# Patient Record
Sex: Male | Born: 1970
Health system: Southern US, Community
[De-identification: ages and names within clinical notes are randomized; demographics above are authoritative.]

## PROBLEM LIST (undated history)

## (undated) ENCOUNTER — Ambulatory Visit: Payer: 59

## (undated) DIAGNOSIS — M109 Gout, unspecified: Secondary | ICD-10-CM

## (undated) DIAGNOSIS — E785 Hyperlipidemia, unspecified: Secondary | ICD-10-CM

## (undated) DIAGNOSIS — N529 Male erectile dysfunction, unspecified: Secondary | ICD-10-CM

## (undated) DIAGNOSIS — E291 Testicular hypofunction: Secondary | ICD-10-CM

## (undated) HISTORY — DX: Male erectile dysfunction, unspecified: N52.9

## (undated) HISTORY — DX: Hyperlipidemia, unspecified: E78.5

## (undated) HISTORY — DX: Gout, unspecified: M10.9

## (undated) HISTORY — DX: Testicular hypofunction: E29.1

---

## 2007-03-20 ENCOUNTER — Ambulatory Visit: Payer: Self-pay | Admitting: General Practice

## 2008-12-01 IMAGING — CR DG CHEST 2V
1 series · 2 of 2 positions shown · non-contrast
Comparison: none

REASON FOR EXAM: OCCUPATIONAL HEALTH SCREEN
COMMENTS:

PROCEDURE:     MDR - MDR CHEST PA(OR AP) AND LATERAL  - March 20, 2007  [DATE]
RESULT:     The lung fields are clear. No pneumonia, pneumothorax or pleural
effusion is seen.  Heart size is normal. The mediastinal and osseous
structures show no significant abnormalities.

[Series 1: view not recorded · 0.17mm/px · 2 of 2 slices shown]
[im 1/2]
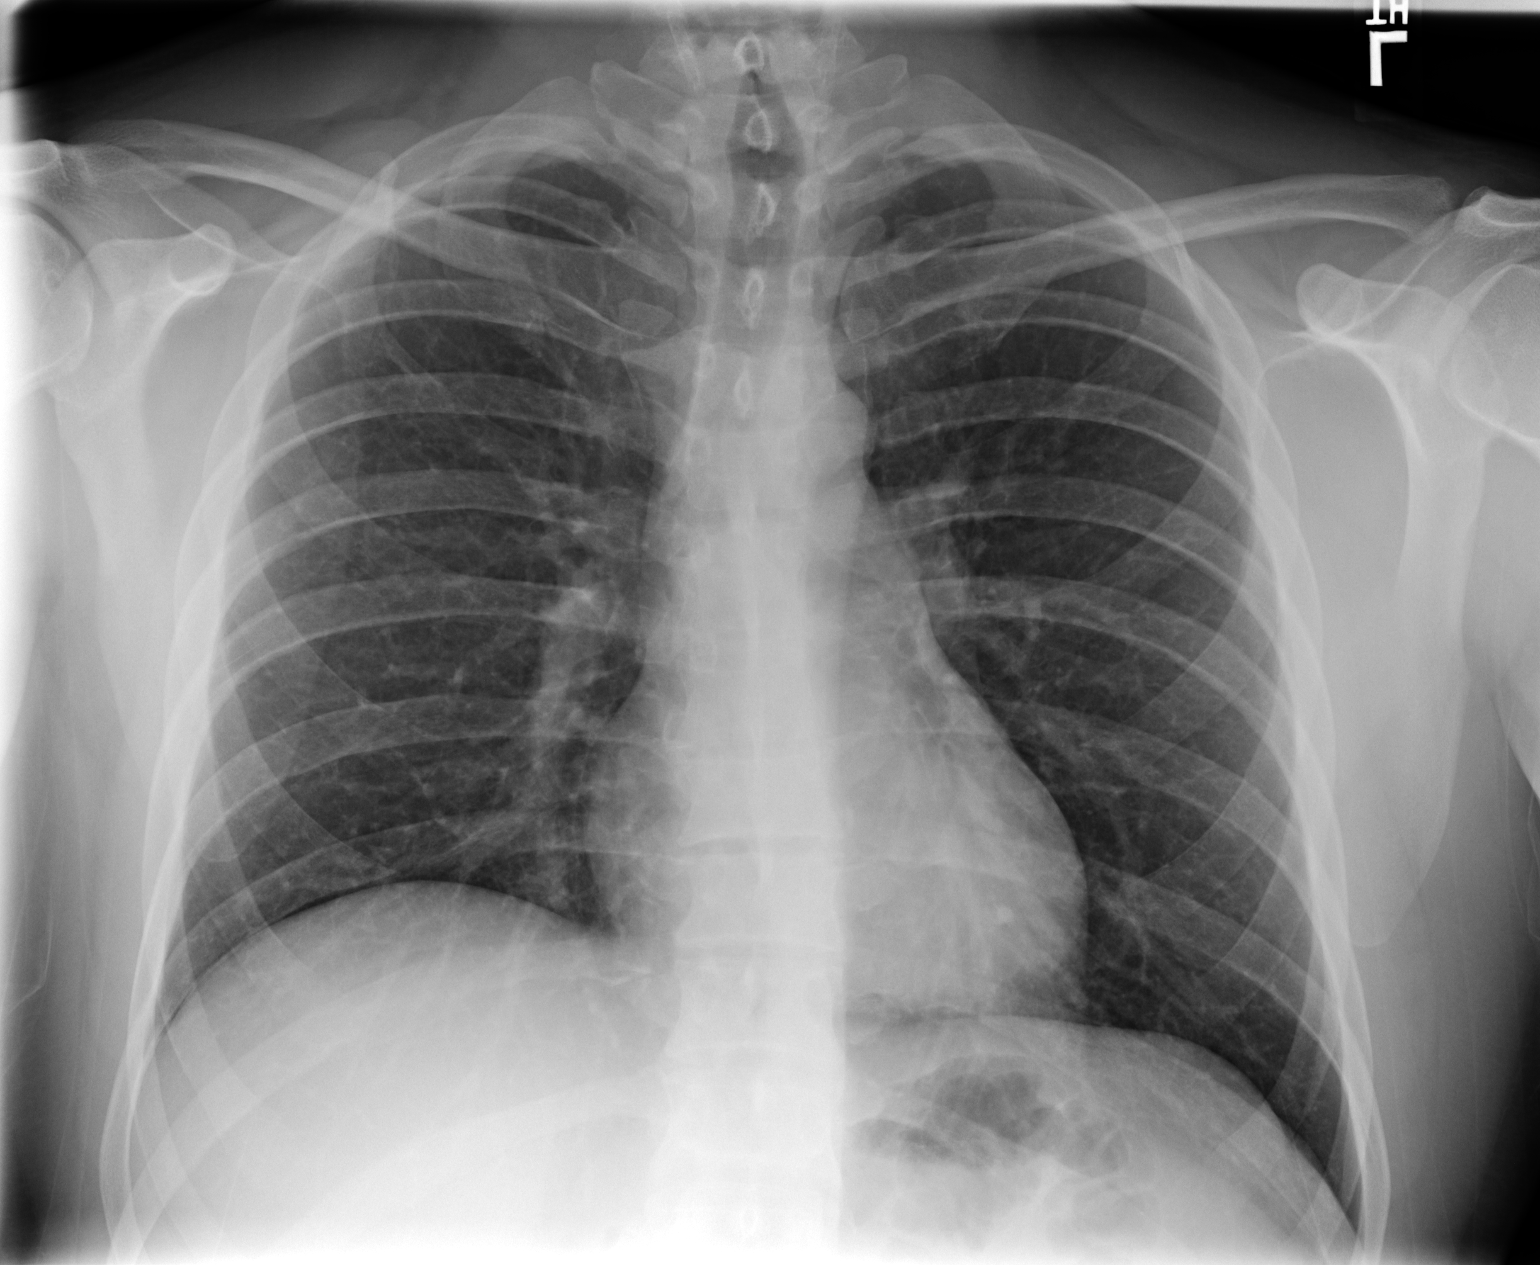
[im 2/2]
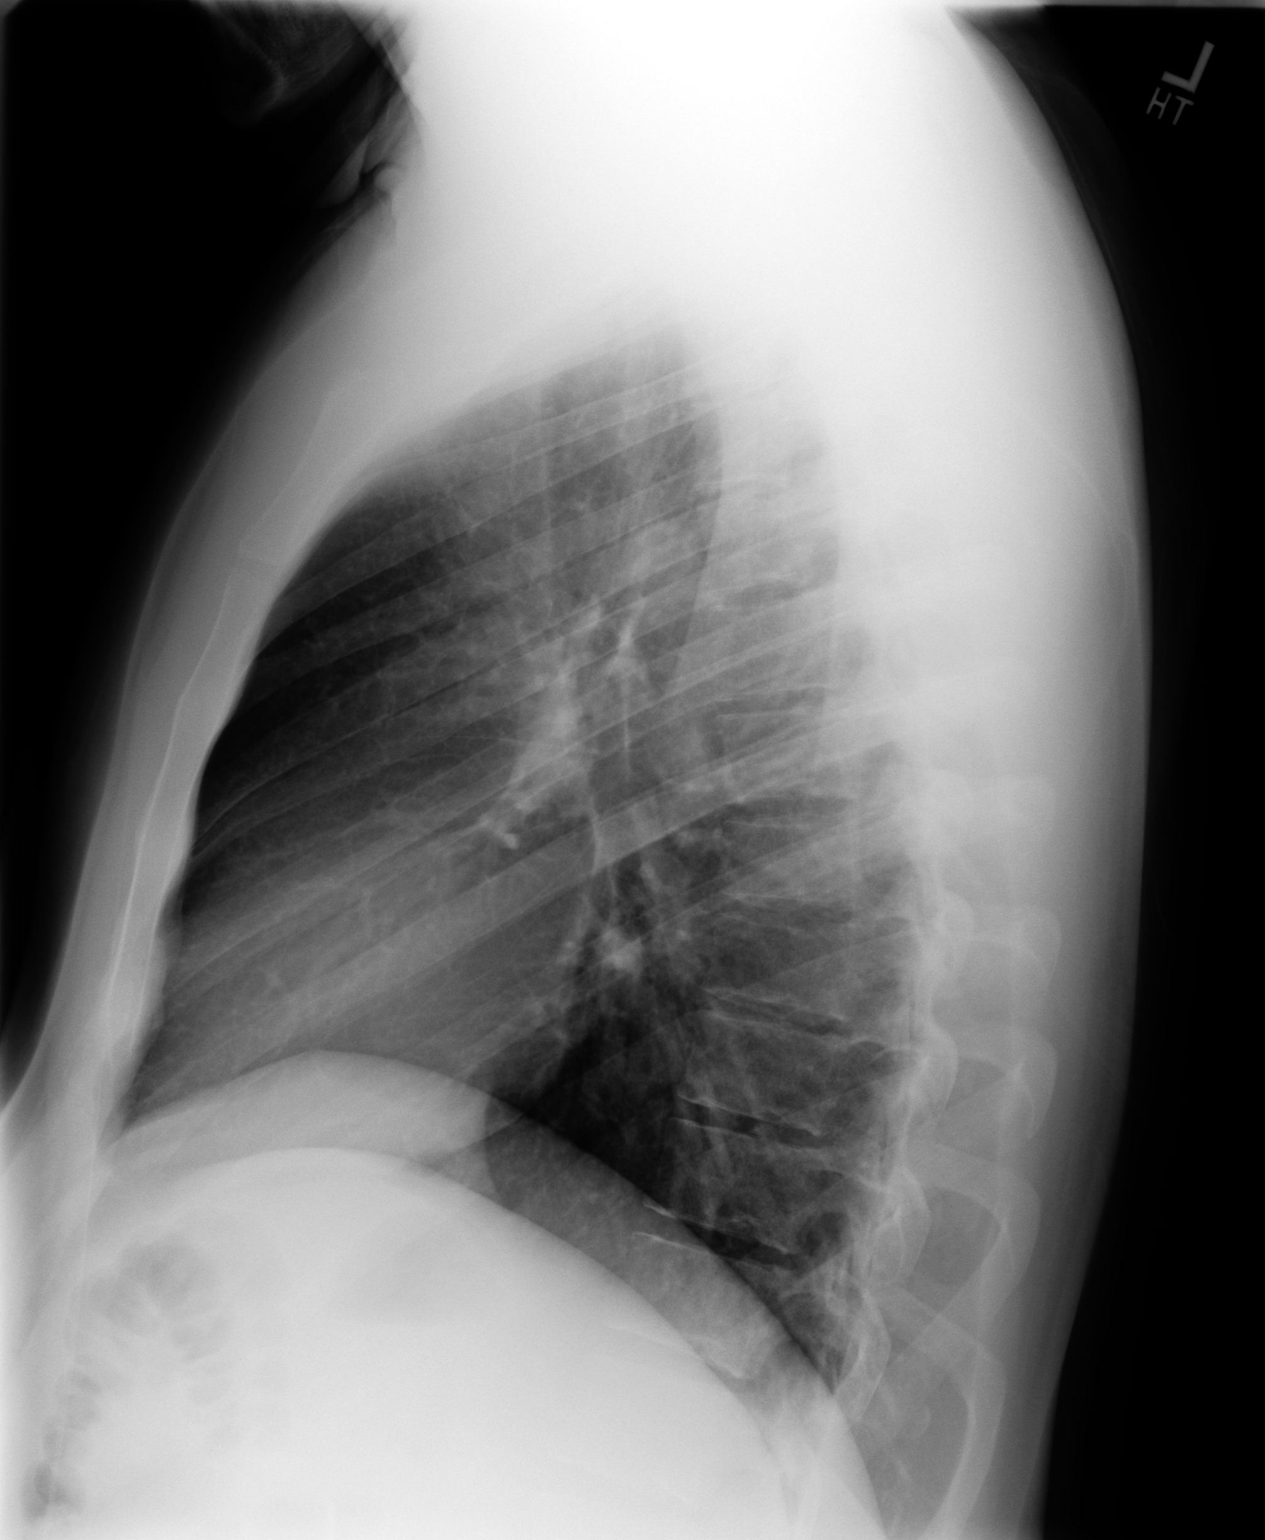

[2 of 2 positions shown; findings below may reference images not displayed]

IMPRESSION: 1.     No significant abnormalities are noted.

## 2009-06-16 ENCOUNTER — Ambulatory Visit: Payer: Self-pay | Admitting: Otolaryngology

## 2011-07-31 ENCOUNTER — Ambulatory Visit: Payer: Self-pay | Admitting: Medical

## 2013-07-17 ENCOUNTER — Other Ambulatory Visit: Payer: Self-pay

## 2013-07-17 ENCOUNTER — Inpatient Hospital Stay: Payer: Self-pay

## 2013-07-17 LAB — BODY FLUID CELL COUNT WITH DIFFERENTIAL
BASOS ABS: 0 %
Eosinophil: 0 %
LYMPHS PCT: 0 %
Neutrophils: 89 %
Nucleated Cell Count: 105837 /mm3
OTHER CELLS BF: 0 %
OTHER MONONUCLEAR CELLS: 11 %

## 2013-07-17 LAB — CBC WITH DIFFERENTIAL/PLATELET
BASOS ABS: 0 10*3/uL (ref 0.0–0.1)
BASOS PCT: 0.3 %
Eosinophil #: 0 10*3/uL (ref 0.0–0.7)
Eosinophil %: 0 %
HCT: 42.7 % (ref 40.0–52.0)
HGB: 14.6 g/dL (ref 13.0–18.0)
Lymphocyte #: 1.3 10*3/uL (ref 1.0–3.6)
Lymphocyte %: 9.4 %
MCH: 32.5 pg (ref 26.0–34.0)
MCHC: 34.2 g/dL (ref 32.0–36.0)
MCV: 95 fL (ref 80–100)
MONO ABS: 1.4 x10 3/mm — AB (ref 0.2–1.0)
MONOS PCT: 10.5 %
NEUTROS PCT: 79.8 %
Neutrophil #: 10.9 10*3/uL — ABNORMAL HIGH (ref 1.4–6.5)
Platelet: 231 10*3/uL (ref 150–440)
RBC: 4.48 10*6/uL (ref 4.40–5.90)
RDW: 12.9 % (ref 11.5–14.5)
WBC: 13.7 10*3/uL — AB (ref 3.8–10.6)

## 2013-07-17 LAB — COMPREHENSIVE METABOLIC PANEL
ALBUMIN: 3.8 g/dL (ref 3.4–5.0)
ANION GAP: 6 — AB (ref 7–16)
AST: 24 U/L (ref 15–37)
Alkaline Phosphatase: 99 U/L
BUN: 11 mg/dL (ref 7–18)
Bilirubin,Total: 3.1 mg/dL — ABNORMAL HIGH (ref 0.2–1.0)
CO2: 27 mmol/L (ref 21–32)
Calcium, Total: 8.7 mg/dL (ref 8.5–10.1)
Chloride: 103 mmol/L (ref 98–107)
Creatinine: 1.36 mg/dL — ABNORMAL HIGH (ref 0.60–1.30)
EGFR (Non-African Amer.): >60
Glucose: 119 mg/dL — ABNORMAL HIGH (ref 65–99)
Osmolality: 272 (ref 275–301)
Potassium: 3.7 mmol/L (ref 3.5–5.1)
SGPT (ALT): 33 U/L (ref 12–78)
Sodium: 136 mmol/L (ref 136–145)
Total Protein: 7.4 g/dL (ref 6.4–8.2)

## 2013-07-18 LAB — BASIC METABOLIC PANEL
Anion Gap: 4 — ABNORMAL LOW (ref 7–16)
BUN: 11 mg/dL (ref 7–18)
CALCIUM: 8.7 mg/dL (ref 8.5–10.1)
Chloride: 104 mmol/L (ref 98–107)
Co2: 27 mmol/L (ref 21–32)
Creatinine: 1.43 mg/dL — ABNORMAL HIGH (ref 0.60–1.30)
Glucose: 128 mg/dL — ABNORMAL HIGH (ref 65–99)
OSMOLALITY: 271 (ref 275–301)
Potassium: 4.2 mmol/L (ref 3.5–5.1)
Sodium: 135 mmol/L — ABNORMAL LOW (ref 136–145)

## 2013-07-18 LAB — CBC WITH DIFFERENTIAL/PLATELET
Basophil #: 0 10*3/uL (ref 0.0–0.1)
Basophil %: 0.3 %
EOS PCT: 0.1 %
Eosinophil #: 0 10*3/uL (ref 0.0–0.7)
HCT: 42 % (ref 40.0–52.0)
HGB: 14.4 g/dL (ref 13.0–18.0)
LYMPHS ABS: 1.2 10*3/uL (ref 1.0–3.6)
Lymphocyte %: 10.2 %
MCH: 33.1 pg (ref 26.0–34.0)
MCHC: 34.3 g/dL (ref 32.0–36.0)
MCV: 97 fL (ref 80–100)
MONO ABS: 1 x10 3/mm (ref 0.2–1.0)
Monocyte %: 9.2 %
Neutrophil #: 9.1 10*3/uL — ABNORMAL HIGH (ref 1.4–6.5)
Neutrophil %: 80.2 %
Platelet: 211 10*3/uL (ref 150–440)
RBC: 4.35 10*6/uL — ABNORMAL LOW (ref 4.40–5.90)
RDW: 13.1 % (ref 11.5–14.5)
WBC: 11.3 10*3/uL — ABNORMAL HIGH (ref 3.8–10.6)

## 2013-07-19 LAB — BASIC METABOLIC PANEL
Anion Gap: 4 — ABNORMAL LOW (ref 7–16)
BUN: 9 mg/dL (ref 7–18)
CO2: 28 mmol/L (ref 21–32)
CREATININE: 1.19 mg/dL (ref 0.60–1.30)
Calcium, Total: 8.4 mg/dL — ABNORMAL LOW (ref 8.5–10.1)
Chloride: 106 mmol/L (ref 98–107)
EGFR (African American): >60
EGFR (Non-African Amer.): >60
Glucose: 116 mg/dL — ABNORMAL HIGH (ref 65–99)
Osmolality: 275 (ref 275–301)
Potassium: 4.1 mmol/L (ref 3.5–5.1)
SODIUM: 138 mmol/L (ref 136–145)

## 2013-07-19 LAB — SEDIMENTATION RATE: Erythrocyte Sed Rate: 57 mm/hr — ABNORMAL HIGH (ref 0–15)

## 2013-07-21 LAB — BODY FLUID CULTURE

## 2013-07-22 LAB — CULTURE, BLOOD (SINGLE)

## 2013-07-31 DIAGNOSIS — L270 Generalized skin eruption due to drugs and medicaments taken internally: Secondary | ICD-10-CM | POA: Insufficient documentation

## 2014-07-06 NOTE — H&P (Signed)
PATIENT NAME:  Jeremy Blackburn, Jeremy Blackburn MR#:  [id number] DATE OF BIRTH:  23-Sep-1970  DATE OF ADMISSION:  07/17/2013  ADMISSION DIAGNOSES: Left elbow cellulitis, abscess and sepsis.   HISTORY OF PRESENT ILLNESS: This is a very pleasant 44 year old male previously quite healthy except a history of gout and meningitis in 2005 due to virus, who I saw in clinic today for fever and cellulitis of his left elbow. His story began 3 weeks ago when he was playing on the carpet with his son. He developed blisters on both elbows and one on the right heel. The left one popped and he was trying to treat it with peroxide and Neosporin. Over the last 3 days however,  he has had increasing swelling, redness and fever. He was seen by Dr. Ilene Qua yesterday and given a shot of ceftriaxone and started on Augmentin; however, it has progressed and is now streaking up his left arm. He was seen today by Reche Dixon, who aspirated pus from the elbow. He was given another dose of ceftriaxone and referred to me. In clinic today, he is febrile to 102.8, tachycardic to 110 and has streaking redness up his arm. He is failing outpatient antibiotics and treatment for this and is being admitted for IV antibiotics and further evaluation.   PAST MEDICAL HISTORY:  1.  Gout.  2.  Viral meningitis in 2005, treated at [facility] with no obvious etiology.   PAST SURGICAL HISTORY: None.   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 He does not smoke or drink. He is married and has children. No animal exposures or other travel.   FAMILY HISTORY: Noncontributory.   REVIEW OF SYSTEMS: Positive for fevers and shaking chills. 11 systems otherwise reviewed and negative except as per history of present illness.   PHYSICAL EXAMINATION:  VITAL SIGNS: Temperature 102.8, pulse 110, blood pressure 128/80 and weight 225.  GENERAL: He is somewhat diaphoretic.  HEENT: Pupils equal, round, and reactive to light and accommodation. Extraocular  movements are intact. Sclerae anicteric. Oropharynx is clear.  HEART: Tachy.  LUNGS: Clear to auscultation bilaterally.  ABDOMEN: Soft, nontender, nondistended.  EXTREMITIES: Left elbow is wrapped post procedure in a pressure wrap. He has streaking extending from it up in the inside of his arm up to his axilla. This area is tender to palpation. No  lower extremity edema.  SKIN: Spreading redness as above.  NEUROLOGIC: Alert and oriented x 3. Grossly nonfocal neuro exam.   IMPRESSION: A 44 year old with sepsis and progressive cellulitis and possible septic arthritis of his left elbow versus bursitis. This followed a carpet burn injury. It has been aspirated by Reche Dixon and cultures are pending as well as cell count.   RECOMMENDATIONS:  1.  Today, we are going to admit the patient and draw blood cultures.  2.  I will start vancomycin and Zosyn pending culture results.  3.  The patient will be given IV fluids for his tachycardia. He will be treated with Tylenol pain control for his fevers and pain.  4.  Advised the patient to elevate his arm as much as we can to help decrease the edema.  5.  I anticipate a 2 day stay given the potential severity of this and some concern for some bacteremia.   ____________________________ Cheral Marker. Ola Spurr, MD dpf:aw D: 07/17/2013 13:38:08 ET T: 07/17/2013 13:52:35 ET JOB#: [id number]  cc: Cheral Marker. Ola Spurr, MD, <Dictator> Havard Radigan Ola Spurr MD ELECTRONICALLY SIGNED 07/18/2013 21:54

## 2016-06-09 DIAGNOSIS — Z0000 Encounter for general adult medical examination without abnormal findings: Secondary | ICD-10-CM | POA: Diagnosis not present

## 2016-06-09 DIAGNOSIS — Z008 Encounter for other general examination: Secondary | ICD-10-CM | POA: Diagnosis not present

## 2016-08-17 DIAGNOSIS — R509 Fever, unspecified: Secondary | ICD-10-CM | POA: Diagnosis not present

## 2016-08-17 DIAGNOSIS — M545 Low back pain: Secondary | ICD-10-CM | POA: Diagnosis not present

## 2016-08-17 DIAGNOSIS — J020 Streptococcal pharyngitis: Secondary | ICD-10-CM | POA: Diagnosis not present

## 2016-09-10 DIAGNOSIS — D225 Melanocytic nevi of trunk: Secondary | ICD-10-CM | POA: Diagnosis not present

## 2016-11-03 DIAGNOSIS — J40 Bronchitis, not specified as acute or chronic: Secondary | ICD-10-CM | POA: Diagnosis not present

## 2016-12-19 DIAGNOSIS — Z23 Encounter for immunization: Secondary | ICD-10-CM | POA: Diagnosis not present

## 2017-06-22 DIAGNOSIS — Z021 Encounter for pre-employment examination: Secondary | ICD-10-CM | POA: Diagnosis not present

## 2017-06-22 DIAGNOSIS — Z008 Encounter for other general examination: Secondary | ICD-10-CM | POA: Diagnosis not present

## 2017-06-22 DIAGNOSIS — Z020 Encounter for examination for admission to educational institution: Secondary | ICD-10-CM | POA: Diagnosis not present

## 2017-06-22 DIAGNOSIS — Z0000 Encounter for general adult medical examination without abnormal findings: Secondary | ICD-10-CM | POA: Diagnosis not present

## 2017-06-22 DIAGNOSIS — Z0110 Encounter for examination of ears and hearing without abnormal findings: Secondary | ICD-10-CM | POA: Diagnosis not present

## 2017-09-08 DIAGNOSIS — D225 Melanocytic nevi of trunk: Secondary | ICD-10-CM | POA: Diagnosis not present

## 2017-09-08 DIAGNOSIS — D2262 Melanocytic nevi of left upper limb, including shoulder: Secondary | ICD-10-CM | POA: Diagnosis not present

## 2017-09-08 DIAGNOSIS — D2261 Melanocytic nevi of right upper limb, including shoulder: Secondary | ICD-10-CM | POA: Diagnosis not present

## 2017-09-19 DIAGNOSIS — R739 Hyperglycemia, unspecified: Secondary | ICD-10-CM | POA: Diagnosis not present

## 2017-09-19 DIAGNOSIS — E785 Hyperlipidemia, unspecified: Secondary | ICD-10-CM | POA: Diagnosis not present

## 2017-10-27 DIAGNOSIS — R7989 Other specified abnormal findings of blood chemistry: Secondary | ICD-10-CM | POA: Diagnosis not present

## 2017-12-22 DIAGNOSIS — E785 Hyperlipidemia, unspecified: Secondary | ICD-10-CM | POA: Diagnosis not present

## 2017-12-22 DIAGNOSIS — Z23 Encounter for immunization: Secondary | ICD-10-CM | POA: Diagnosis not present

## 2017-12-22 DIAGNOSIS — N529 Male erectile dysfunction, unspecified: Secondary | ICD-10-CM | POA: Diagnosis not present

## 2017-12-22 DIAGNOSIS — R7989 Other specified abnormal findings of blood chemistry: Secondary | ICD-10-CM | POA: Diagnosis not present

## 2017-12-27 DIAGNOSIS — E785 Hyperlipidemia, unspecified: Secondary | ICD-10-CM | POA: Diagnosis not present

## 2018-04-03 DIAGNOSIS — J329 Chronic sinusitis, unspecified: Secondary | ICD-10-CM | POA: Diagnosis not present

## 2018-04-03 DIAGNOSIS — J40 Bronchitis, not specified as acute or chronic: Secondary | ICD-10-CM | POA: Diagnosis not present

## 2018-04-03 DIAGNOSIS — K219 Gastro-esophageal reflux disease without esophagitis: Secondary | ICD-10-CM | POA: Diagnosis not present

## 2018-06-27 DIAGNOSIS — R739 Hyperglycemia, unspecified: Secondary | ICD-10-CM | POA: Diagnosis not present

## 2018-06-27 DIAGNOSIS — R7989 Other specified abnormal findings of blood chemistry: Secondary | ICD-10-CM | POA: Diagnosis not present

## 2018-06-27 DIAGNOSIS — E785 Hyperlipidemia, unspecified: Secondary | ICD-10-CM | POA: Diagnosis not present

## 2018-06-27 DIAGNOSIS — Z125 Encounter for screening for malignant neoplasm of prostate: Secondary | ICD-10-CM | POA: Diagnosis not present

## 2019-01-16 ENCOUNTER — Encounter: Payer: Self-pay | Admitting: Urology

## 2019-01-16 ENCOUNTER — Other Ambulatory Visit: Payer: Self-pay

## 2019-01-16 ENCOUNTER — Ambulatory Visit: Payer: 59 | Admitting: Urology

## 2019-01-16 VITALS — BP 140/90 | HR 67 | Ht 72.0 in | Wt 236.0 lb

## 2019-01-16 DIAGNOSIS — N529 Male erectile dysfunction, unspecified: Secondary | ICD-10-CM

## 2019-01-16 DIAGNOSIS — R7989 Other specified abnormal findings of blood chemistry: Secondary | ICD-10-CM | POA: Diagnosis not present

## 2019-01-16 DIAGNOSIS — M71129 Other infective bursitis, unspecified elbow: Secondary | ICD-10-CM | POA: Insufficient documentation

## 2019-01-16 MED ORDER — TADALAFIL 5 MG PO TABS: 5.0000 mg | ORAL_TABLET | Freq: Every day | ORAL | 3 refills | Status: DC | PRN

## 2019-01-16 NOTE — Patient Instructions (Addendum)
Tadalafil tablets (Cialis) What is this medicine? TADALAFIL (tah DA la fil) is used to treat erection problems in men. It is also used for enlargement of the prostate gland in men, a condition called benign prostatic hyperplasia or BPH. This medicine improves urine flow and reduces BPH symptoms. This medicine can also treat both erection problems and BPH when they occur together. This medicine may be used for other purposes; ask your health care provider or pharmacist if you have questions. COMMON BRAND NAME(S): Adcirca, ALYQ, Cialis What should I tell my health care provider before I take this medicine? They need to know if you have any of these conditions:  bleeding disorders  eye or vision problems, including a rare inherited eye disease called retinitis pigmentosa  anatomical deformation of the penis, Peyronie's disease, or history of priapism (painful and prolonged erection)  heart disease, angina, a history of heart attack, irregular heart beats, or other heart problems  high or low blood pressure  history of blood diseases, like sickle cell anemia or leukemia  history of stomach bleeding  kidney disease  liver disease  stroke  an unusual or allergic reaction to tadalafil, other medicines, foods, dyes, or preservatives  pregnant or trying to get pregnant  breast-feeding How should I use this medicine? Take this medicine by mouth with a glass of water. Follow the directions on the prescription label. You may take this medicine with or without meals. When this medicine is used for erection problems, your doctor may prescribe it to be taken once daily or as needed. If you are taking the medicine as needed, you may be able to have sexual activity 30 minutes after taking it and for up to 36 hours after taking it. Whether you are taking the medicine as needed or once daily, you should not take more than one dose per day. If you are taking this medicine for symptoms of benign  prostatic hyperplasia (BPH) or to treat both BPH and an erection problem, take the dose once daily at about the same time each day. Do not take your medicine more often than directed. Talk to your pediatrician regarding the use of this medicine in children. Special care may be needed. Overdosage: If you think you have taken too much of this medicine contact a poison control center or emergency room at once. NOTE: This medicine is only for you. Do not share this medicine with others. What if I miss a dose? If you are taking this medicine as needed for erection problems, this does not apply. If you miss a dose while taking this medicine once daily for an erection problem, benign prostatic hyperplasia, or both, take it as soon as you remember, but do not take more than one dose per day. What may interact with this medicine? Do not take this medicine with any of the following medications:  nitrates like amyl nitrite, isosorbide dinitrate, isosorbide mononitrate, nitroglycerin  other medicines for erectile dysfunction like avanafil, sildenafil, vardenafil  other tadalafil products (Adcirca)  riociguat This medicine may also interact with the following medications:  certain drugs for high blood pressure  certain drugs for the treatment of HIV infection or AIDS  certain drugs used for fungal or yeast infections, like fluconazole, itraconazole, ketoconazole, and voriconazole  certain drugs used for seizures like carbamazepine, phenytoin, and phenobarbital  grapefruit juice  macrolide antibiotics like clarithromycin, erythromycin, troleandomycin  medicines for prostate problems  rifabutin, rifampin or rifapentine This list may not describe all possible interactions. Give your   health care provider a list of all the medicines, herbs, non-prescription drugs, or dietary supplements you use. Also tell them if you smoke, drink alcohol, or use illegal drugs. Some items may interact with your  medicine. What should I watch for while using this medicine? If you notice any changes in your vision while taking this drug, call your doctor or health care professional as soon as possible. Stop using this medicine and call your health care provider right away if you have a loss of sight in one or both eyes. Contact your doctor or health care professional right away if the erection lasts longer than 4 hours or if it becomes painful. This may be a sign of serious problem and must be treated right away to prevent permanent damage. If you experience symptoms of nausea, dizziness, chest pain or arm pain upon initiation of sexual activity after taking this medicine, you should refrain from further activity and call your doctor or health care professional as soon as possible. Do not drink alcohol to excess (examples, 5 glasses of wine or 5 shots of whiskey) when taking this medicine. When taken in excess, alcohol can increase your chances of getting a headache or getting dizzy, increasing your heart rate or lowering your blood pressure. Using this medicine does not protect you or your partner against HIV infection (the virus that causes AIDS) or other sexually transmitted diseases. What side effects may I notice from receiving this medicine? Side effects that you should report to your doctor or health care professional as soon as possible:  allergic reactions like skin rash, itching or hives, swelling of the face, lips, or tongue  breathing problems  changes in hearing  changes in vision  chest pain  fast, irregular heartbeat  prolonged or painful erection  seizures Side effects that usually do not require medical attention (report to your doctor or health care professional if they continue or are bothersome):  back pain  dizziness  flushing  headache  indigestion  muscle aches  nausea  stuffy or runny nose This list may not describe all possible side effects. Call your doctor  for medical advice about side effects. You may report side effects to FDA at 1-800-FDA-1088. Where should I keep my medicine? Keep out of the reach of children. Store at room temperature between 15 and 30 degrees C (59 and 86 degrees F). Throw away any unused medicine after the expiration date. NOTE: This sheet is a summary. It may not cover all possible information. If you have questions about this medicine, talk to your doctor, pharmacist, or health care provider.  2020 Elsevier/Gold Standard (2013-07-20 13:15:49)   Erectile Dysfunction Erectile dysfunction (ED) is the inability to get or keep an erection in order to have sexual intercourse. Erectile dysfunction may include:  Inability to get an erection.  Lack of enough hardness of the erection to allow penetration.  Loss of the erection before sex is finished. What are the causes? This condition may be caused by:  Certain medicines, such as: ? Pain relievers. ? Antihistamines. ? Antidepressants. ? Blood pressure medicines. ? Water pills (diuretics). ? Ulcer medicines. ? Muscle relaxants. ? Drugs.  Excessive drinking.  Psychological causes, such as: ? Anxiety. ? Depression. ? Sadness. ? Exhaustion. ? Performance fear. ? Stress.  Physical causes, such as: ? Artery problems. This may include diabetes, smoking, liver disease, or atherosclerosis. ? High blood pressure. ? Hormonal problems, such as low testosterone. ? Obesity. ? Nerve problems. This may include back  or pelvic injuries, diabetes mellitus, multiple sclerosis, or Parkinson disease. What are the signs or symptoms? Symptoms of this condition include:  Inability to get an erection.  Lack of enough hardness of the erection to allow penetration.  Loss of the erection before sex is finished.  Normal erections at some times, but with frequent unsatisfactory episodes.  Low sexual satisfaction in either partner due to erection problems.  A curved penis  occurring with erection. The curve may cause pain or the penis may be too curved to allow for intercourse.  Never having nighttime erections. How is this diagnosed? This condition is often diagnosed by:  Performing a physical exam to find other diseases or specific problems with the penis.  Asking you detailed questions about the problem.  Performing blood tests to check for diabetes mellitus or to measure hormone levels.  Performing other tests to check for underlying health conditions.  Performing an ultrasound exam to check for scarring.  Performing a test to check blood flow to the penis.  Doing a sleep study at home to measure nighttime erections. How is this treated? This condition may be treated by:  Medicine taken by mouth to help you achieve an erection (oral medicine).  Hormone replacement therapy to replace low testosterone levels.  Medicine that is injected into the penis. Your health care provider may instruct you how to give yourself these injections at home.  Vacuum pump. This is a pump with a ring on it. The pump and ring are placed on the penis and used to create pressure that helps the penis become erect.  Penile implant surgery. In this procedure, you may receive: ? An inflatable implant. This consists of cylinders, a pump, and a reservoir. The cylinders can be inflated with a fluid that helps to create an erection, and they can be deflated after intercourse. ? A semi-rigid implant. This consists of two silicone rubber rods. The rods provide some rigidity. They are also flexible, so the penis can both curve downward in its normal position and become straight for sexual intercourse.  Blood vessel surgery, to improve blood flow to the penis. During this procedure, a blood vessel from a different part of the body is placed into the penis to allow blood to flow around (bypass) damaged or blocked blood vessels.  Lifestyle changes, such as exercising more, losing  weight, and quitting smoking. Follow these instructions at home: Medicines   Take over-the-counter and prescription medicines only as told by your health care provider. Do not increase the dosage without first discussing it with your health care provider.  If you are using self-injections, perform injections as directed by your health care provider. Make sure to avoid any veins that are on the surface of the penis. After giving an injection, apply pressure to the injection site for 5 minutes. General instructions  Exercise regularly, as directed by your health care provider. Work with your health care provider to lose weight, if needed.  Do not use any products that contain nicotine or tobacco, such as cigarettes and e-cigarettes. If you need help quitting, ask your health care provider.  Before using a vacuum pump, read the instructions that come with the pump and discuss any questions with your health care provider.  Keep all follow-up visits as told by your health care provider. This is important. Contact a health care provider if:  You feel nauseous.  You vomit. Get help right away if:  You are taking oral or injectable medicines and you  have an erection that lasts longer than 4 hours. If your health care provider is unavailable, go to the nearest emergency room for evaluation. An erection that lasts much longer than 4 hours can result in permanent damage to your penis.  You have severe pain in your groin or abdomen.  You develop redness or severe swelling of your penis.  You have redness spreading up into your groin or lower abdomen.  You are unable to urinate.  You experience chest pain or a rapid heart beat (palpitations) after taking oral medicines. Summary  Erectile dysfunction (ED) is the inability to get or keep an erection during sexual intercourse. This problem can usually be treated successfully.  This condition is diagnosed based on a physical exam, your  symptoms, and tests to determine the cause. Treatment varies depending on the cause, and may include medicines, hormone therapy, surgery, or vacuum pump.  You may need follow-up visits to make sure that you are using your medicines or devices correctly.  Get help right away if you are taking or injecting medicines and you have an erection that lasts longer than 4 hours. This information is not intended to replace advice given to you by your health care provider. Make sure you discuss any questions you have with your health care provider. Document Released: 02/27/2000 Document Revised: 02/11/2017 Document Reviewed: 03/17/2016 Elsevier Patient Education  2020 [employer].

## 2019-01-16 NOTE — Progress Notes (Signed)
01/16/19 1:15 PM   Kristeen Mans 1970/11/24 [id number]  Referring provider: Sofie Hartigan, MD [place],  [place] [address]  CC: Erectile dysfunction and low testosterone  HPI: I saw Mr. Verhagen in urology clinic in consultation for erectile dysfunction low testosterone from Dr. Ellison Hughs.  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  He was found to have 2 low testosterone levels in the summer 2019 of 109 and 207 and was started on AndroGel by his primary care physician.  He was also started on 20 mg sildenafil at that time for erectile dysfunction.  He had severe headache with the sildenafil and he discontinued this medication.  He did not feel that the exogenous testosterone improved his energy or his erections, and he self stopped this medication about a month ago.  There are no aggravating or alleviating factors.  He denies any urinary symptoms of weak stream, dysuria, or urgency/frequency.  He denies any gross hematuria.  He is a never smoker.  He does not drink alcohol.  He is trying to eat healthier since he is had some slightly elevated triglycerides on lab work recently.  He is primarily bothered by the erectile dysfunction, as opposed to the low testosterone/decreased energy.  Shim score is 11 indicating moderate erectile dysfunction.   PMH: Past Medical History:  Diagnosis Date  . ED (erectile dysfunction)   . Gout   . Hyperlipidemia   . Hypogonadism in male     Surgical History: Denies  Allergies:  Allergies  Allergen Reactions  . Sulfamethoxazole-Trimethoprim Rash    Family History: No family history on file.  Social History:  has no history on file for tobacco, alcohol, and drug.  ROS: Please see flowsheet from today's date for complete review of systems.  Physical Exam: BP 140/90 (BP Location: Left Arm, Patient Position:  Sitting, Cuff Size: Normal)   Pulse 67   Ht 6' (1.829 m)   Wt 236 lb (107 kg)   BMI 32.01 kg/m    Constitutional:  Alert and oriented, No acute distress. Cardiovascular: No clubbing, cyanosis, or edema. Respiratory: Normal respiratory effort, no increased work of breathing. GI: Abdomen is soft, nontender, nondistended, no abdominal masses Lymph: No cervical or inguinal lymphadenopathy. Skin: No rashes, bruises or suspicious lesions. Neurologic: Grossly intact, no focal deficits, moving all 4 extremities. Psychiatric: Normal mood and affect.  Laboratory Data: Reviewed, see HPI  Assessment & Plan:   In summary, the patient is a healthy 48 year old male with low testosterone and erectile dysfunction.  We discussed the controversy surrounding testosterone and testosterone replacement.  We also discussed the treatment strategies for erectile dysfunction including PDE 5 inhibitors, vacuum device, penile injections, and penile implants.  I recommended a trial of a different PDE 5 inhibitor since he had adverse side effects from the sildenafil.  We discussed the risks and benefits of these medications at length.  He would like to start by trying Cialis to see if it improves his erections, and consider testosterone replacement again in the future if he still having difficulty with erections despite oral medications.  -Trial of Cialis 5 mg on demand or every other day, we discussed he can take up to 20 mg Cialis at a time.  We also reviewed the long half-life compared to sildenafil.  Good Rx coupon provided -Virtual phone follow-up in 1 week months for  symptom check.  If still having bothersome ED will titrate dose up, or consider visit with Larene Beach to discuss penile injections  Billey Co, MD  [facility] [address], [place], [place] [address] [phone]

## 2019-02-20 ENCOUNTER — Telehealth (INDEPENDENT_AMBULATORY_CARE_PROVIDER_SITE_OTHER): Payer: 59 | Admitting: Urology

## 2019-02-20 ENCOUNTER — Other Ambulatory Visit: Payer: Self-pay

## 2019-02-20 DIAGNOSIS — N529 Male erectile dysfunction, unspecified: Secondary | ICD-10-CM

## 2019-02-20 MED ORDER — TADALAFIL 5 MG PO TABS: 5.0000 mg | ORAL_TABLET | Freq: Every day | ORAL | 3 refills | Status: DC | PRN

## 2019-02-20 NOTE — Progress Notes (Signed)
Virtual Visit via Telephone Note  I connected with Jeremy Blackburn on 02/20/19 at  3:45 PM EST by telephone and verified that I am speaking with the correct person using two identifiers.   I discussed the limitations, risks, security and privacy concerns of performing an evaluation and management service by telephone and the availability of in person appointments. We discussed the impact of the COVID-19 pandemic on the healthcare system, and the importance of social distancing and reducing patient and provider exposure. I also discussed with the patient that there may be a patient responsible charge related to this service. The patient expressed understanding and agreed to proceed.  Reason for visit: ED  History of Present Illness: I had phone follow-up with Jeremy Blackburn regarding his erectile dysfunction.  Briefly, he is a healthy 48 year old male with a history of low testosterone and moderate erectile dysfunction who had previously failed sildenafil prescribed by his PCP.  At our visit in early November we tried Cialis 5 mg on demand.  He is very pleased with this medication and he is not having any adverse effects.  He reports he is having good erections sufficient for intercourse with the Cialis.  He denies any complaints.  He would like to continue to take 5 mg dose on an as-needed basis.  Follow Up: Follow-up as needed, okay for PCP to refill Cialis as needed, we are happy to see him in the future if other issues arise   I discussed the assessment and treatment plan with the patient. The patient was provided an opportunity to ask questions and all were answered. The patient agreed with the plan and demonstrated an understanding of the instructions.   The patient was advised to call back or seek an in-person evaluation if the symptoms worsen or if the condition fails to improve as anticipated.  I provided 12 minutes of non-face-to-face time during this encounter.   Billey Co, MD

## 2021-03-31 ENCOUNTER — Other Ambulatory Visit: Payer: Self-pay

## 2021-03-31 ENCOUNTER — Ambulatory Visit
Admission: EM | Admit: 2021-03-31 | Discharge: 2021-03-31 | Disposition: A | Discharge status: Home / Self Care | Payer: 59 | Attending: Internal Medicine | Admitting: Internal Medicine

## 2021-03-31 DIAGNOSIS — J111 Influenza due to unidentified influenza virus with other respiratory manifestations: Secondary | ICD-10-CM | POA: Insufficient documentation

## 2021-03-31 DIAGNOSIS — Z20822 Contact with and (suspected) exposure to covid-19: Secondary | ICD-10-CM | POA: Insufficient documentation

## 2021-03-31 LAB — RESP PANEL BY RT-PCR (FLU A&B, COVID) ARPGX2
Influenza A by PCR: NEGATIVE
Influenza B by PCR: NEGATIVE
SARS Coronavirus 2 by RT PCR: NEGATIVE

## 2021-03-31 MED ORDER — BENZONATATE 200 MG PO CAPS: 200.0000 mg | ORAL_CAPSULE | Freq: Three times a day (TID) | ORAL | 0 refills | Status: DC | PRN

## 2021-03-31 MED ORDER — ALBUTEROL SULFATE HFA 108 (90 BASE) MCG/ACT IN AERS
2.0000 | INHALATION_SPRAY | Freq: Once | RESPIRATORY_TRACT | Status: AC
Start: 2021-03-31 — End: 2021-03-31
  Administered 2021-03-31: 2 via RESPIRATORY_TRACT

## 2021-03-31 NOTE — ED Provider Notes (Signed)
[facility]    CSN: [id number] Arrival date & time: 03/31/21  5625      History   Chief Complaint Chief Complaint  Patient presents with   Cough   Nasal Congestion   Sore Throat    HPI Jeremy Blackburn is a 51 y.o. male.  He presents today with couple days history of chest tightness, heavy cough with some posttussive emesis.  This started 4 to 5 days ago with a little bit of sore throat, sinus congestion.  He has been taking OTC cough products.  He has some headache with cough, and now his ribs are sore with coughing.  Runny nose, head congestion.  No diarrhea.  Appetite is diminished.  He has been pushing fluids.  No fever.  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  HPI  Past Medical History:  Diagnosis Date   ED (erectile dysfunction)    Gout    Hyperlipidemia    Hypogonadism in male     Patient Active Problem List   Diagnosis Date Noted   Septic bursitis of elbow 01/16/2019   Rash, drug 07/31/2013    History reviewed. No pertinent surgical history.     Home Medications    Prior to Admission medications   Medication Sig Start Date End Date Taking? Authorizing Provider  benzonatate (TESSALON) 200 MG capsule Take 1 capsule (200 mg total) by mouth 3 (three) times daily as needed for cough. 03/31/21  Yes Wynona Luna, MD  colchicine 0.6 MG tablet Take by mouth. 06/27/18  Yes [provider]  ibuprofen (ADVIL) 200 MG tablet Take by mouth.   Yes [provider]  sildenafil (REVATIO) 20 MG tablet Take by mouth. 09/19/17  Yes [provider]  tadalafil (CIALIS) 5 MG tablet Take 1 tablet (5 mg total) by mouth daily as needed for erectile dysfunction. 01/16/19  Yes Billey Co, MD  tadalafil (CIALIS) 5 MG tablet Take 1 tablet (5 mg total) by mouth daily as needed for erectile dysfunction. 02/20/19  Yes Billey Co, MD  valsartan (DIOVAN) 80 MG tablet Take 80 mg by mouth daily. 02/24/21  Yes  [provider]  testosterone (ANDROGEL) 50 MG/5GM (1%) GEL Apply topically. 07/25/18   [provider]    Family History History reviewed. No pertinent family history.  Social History Social History   Tobacco Use   Smoking status: Never   Smokeless tobacco: Never  Vaping Use   Vaping Use: Never used  Substance Use Topics   Alcohol use: Never   Drug use: Never     Allergies   Sulfamethoxazole-trimethoprim   Review of Systems Review of Systems see HPI   Physical Exam Triage Vital Signs ED Triage Vitals  Enc Vitals Group     BP 03/31/21 0831 (!) 147/105     Pulse Rate 03/31/21 0831 76     Resp 03/31/21 0831 18     Temp 03/31/21 0831 98.7 F (37.1 C)     Temp Source 03/31/21 0831 Oral     SpO2 03/31/21 0831 98 %     Weight 03/31/21 0829 235 lb (106.6 kg)     Height 03/31/21 0829 6' (1.829 m)     Pain Score 03/31/21 0829 0     Pain Loc --    Updated Vital Signs BP (!) 147/105 (BP Location: Left Arm)    Pulse 76    Temp 98.7 F (37.1 C) (Oral)  Resp 18    Ht 6' (1.829 m)    Wt 106.6 kg    SpO2 98%    BMI 31.87 kg/m   Physical Exam Constitutional:      General: He is not in acute distress.    Appearance: He is ill-appearing. He is not toxic-appearing.     Comments: Nicely groomed, voice sounds very congested.  Frequent very harsh coughing spasms.  HENT:     Head: Atraumatic.     Comments: Bilateral TMs are dull, no erythema Moderately severe nasal congestion bilaterally Posterior pharynx is slightly injected, with postnasal drainage evident    Mouth/Throat:     Mouth: Mucous membranes are moist.  Eyes:     Conjunctiva/sclera:     Right eye: Right conjunctiva is not injected. No exudate.    Left eye: Left conjunctiva is not injected. No exudate.    Comments: Conjugate gaze observed  Cardiovascular:     Rate and Rhythm: Normal rate and regular rhythm.  Pulmonary:     Effort: Pulmonary effort is normal. No respiratory distress.      Breath sounds: Wheezing present. No rhonchi.     Comments: Musical wheezing in anterior lung fields bilaterally Abdominal:     General: There is no distension.  Musculoskeletal:     Cervical back: Neck supple.     Comments: Walked into the urgent care independently  Skin:    General: Skin is warm and dry.     Comments: Pink, no cyanosis  Neurological:     Mental Status: He is alert.     Comments: Face is symmetric, speech is clear, coherent, logical     UC Treatments / Results  Labs (all labs ordered are listed, but only abnormal results are displayed) Labs Reviewed  RESP PANEL BY RT-PCR (FLU A&B, COVID) [id number]  Covid and flu A/flu B tests all negative  EKG N/A  Radiology No results found. N/A  Procedures Procedures (including critical care time) N/A  Medications Ordered in UC Medications  albuterol (VENTOLIN HFA) 108 (90 Base) MCG/ACT inhaler 2 puff (2 puffs Inhalation Given 03/31/21 0934)  Spacer device utilized with improvement in cough/chest tightness  Final Clinical Impressions(s) / UC Diagnoses   Final diagnoses:  Influenza-like illness     Discharge Instructions      Symptoms today seem most consistent with a viral respiratory infection.  Tests for covid and flu A/flu B were negative today.  An albuterol inhaler with spacer device was given--use 2 puffs every 4 hours as needed to help with chest tightness and coughing spells.  Prescription for benzonatate cough perles was sent to the pharmacy also.  Push fluids and rest.  Take tylenol or advil otc as needed for fever, discomfort.  Eat fruits and vegetables to help your immune system do its best work.  Anticipate gradual improvement over the next several days.  Recheck for new fever >100.5, increasing phlegm production/nasal discharge, or if not starting to improve in a few days.       ED Prescriptions     Medication Sig Dispense Auth. Provider   benzonatate (TESSALON) 200 MG capsule Take 1 capsule (200 mg  total) by mouth 3 (three) times daily as needed for cough. 30 capsule Wynona Luna, MD      PDMP not reviewed this encounter.   Wynona Luna, MD 04/01/21 [phone]

## 2021-03-31 NOTE — ED Triage Notes (Signed)
Pt c/o sore throat, SOB, chest congestion, headache, cough x5days.  Pt has been taking OTC cough medication and is worried about possible bronchitis.   Pt works at [employer] and does not know if he has been around anyone sick. Pt has not taken a home covid test.

## 2021-03-31 NOTE — Discharge Instructions (Addendum)
Symptoms today seem most consistent with a viral respiratory infection.  Tests for covid and flu A/flu B were negative today.  An albuterol inhaler with spacer device was given--use 2 puffs every 4 hours as needed to help with chest tightness and coughing spells.  Prescription for benzonatate cough perles was sent to the pharmacy also.  Push fluids and rest.  Take tylenol or advil otc as needed for fever, discomfort.  Eat fruits and vegetables to help your immune system do its best work.  Anticipate gradual improvement over the next several days.  Recheck for new fever >100.5, increasing phlegm production/nasal discharge, or if not starting to improve in a few days.

## 2021-05-12 ENCOUNTER — Encounter: Payer: Self-pay | Admitting: Urology

## 2021-05-12 ENCOUNTER — Other Ambulatory Visit: Payer: Self-pay

## 2021-05-12 ENCOUNTER — Ambulatory Visit: Payer: 59 | Admitting: Urology

## 2021-05-12 DIAGNOSIS — N529 Male erectile dysfunction, unspecified: Secondary | ICD-10-CM | POA: Diagnosis not present

## 2021-05-12 DIAGNOSIS — Z125 Encounter for screening for malignant neoplasm of prostate: Secondary | ICD-10-CM | POA: Diagnosis not present

## 2021-05-12 MED ORDER — TADALAFIL 5 MG PO TABS: 5.0000 mg | ORAL_TABLET | Freq: Every day | ORAL | 3 refills | Status: DC | PRN

## 2021-05-12 NOTE — Progress Notes (Signed)
° °  05/12/2021 11:31 AM   Jeremy Blackburn [birth year] [id number]  Reason for visit: Follow up ED, low testosterone, PSA screening  HPI: 51 year old male who I last saw in December 2020 for ED and low testosterone.  He had 2 low testosterone levels in 2019 of 109 and 207, and was started on AndroGel by his PCP, in addition to sildenafil.  He did not have any improvement in his ED on the AndroGel, and discontinued that medication.  He has not had further testosterone testing since that time.  At our visit in November 2020 we started Cialis 5 mg on demand which continues to work very well for him for his erections.  He was recently started on a new blood pressure medication, valsartan by PCP, and he made an appointment to see if it was okay to take Cialis with this medication.  We discussed that valsartan and typically does not cause problems with erections, and that any blood pressure medications with Cialis have a low risk of low blood pressure.  Blood pressure is normal today at 129/79, and I recommended continuing to take the Cialis on an as-needed basis.  He also had a PSA drawn in June 2022 which was normal at 0.76.  We reviewed the AUA guidelines regarding the risks and benefits of screening, and he can continue screening PSA every 1 to 2 years with PCP.  Cialis 5 mg as needed refilled, 90 tabs, 3 refills Can be filled by PCP in the future, follow-up with urology as needed  Billey Co, [facility] [address], [place], [place] [address] [phone]

## 2022-06-09 ENCOUNTER — Ambulatory Visit: Payer: 59

## 2022-06-09 DIAGNOSIS — K621 Rectal polyp: Secondary | ICD-10-CM | POA: Diagnosis not present

## 2022-06-09 DIAGNOSIS — D123 Benign neoplasm of transverse colon: Secondary | ICD-10-CM

## 2022-06-09 DIAGNOSIS — Z1211 Encounter for screening for malignant neoplasm of colon: Secondary | ICD-10-CM

## 2022-06-09 DIAGNOSIS — K635 Polyp of colon: Secondary | ICD-10-CM

## 2022-11-16 ENCOUNTER — Encounter: Payer: Self-pay | Admitting: Urology

## 2022-11-16 ENCOUNTER — Ambulatory Visit (INDEPENDENT_AMBULATORY_CARE_PROVIDER_SITE_OTHER): Payer: 59 | Admitting: Urology

## 2022-11-16 VITALS — BP 152/75 | HR 72 | Ht 72.0 in | Wt 238.4 lb

## 2022-11-16 DIAGNOSIS — Z125 Encounter for screening for malignant neoplasm of prostate: Secondary | ICD-10-CM | POA: Diagnosis not present

## 2022-11-16 DIAGNOSIS — N529 Male erectile dysfunction, unspecified: Secondary | ICD-10-CM | POA: Diagnosis not present

## 2022-11-16 MED ORDER — TADALAFIL 5 MG PO TABS: 5.0000 mg | ORAL_TABLET | Freq: Every day | ORAL | 4 refills | Status: AC

## 2022-11-16 NOTE — Progress Notes (Signed)
   11/16/2022 3:34 PM   Diona Browner 1970-07-17 [id number]  Reason for visit: Follow up ED, PSA screening  HPI: 52 year old male who I saw in February 2023 for ED and low testosterone.  He had 2 low testosterone levels in 2019 of 109 and 207 and took about a month of AndroGel prescribed by PCP.  He had no improvement in symptoms or ED on the AndroGel and he stopped this after about a month.  He has been on Cialis 5 mg on demand over the last few years with good results.  He feels like the 5 mg may not be quite as effective as prior.  We discussed options including checking a morning testosterone, or changing the Cialis to either 10 mg as needed prior to sexual activity, or a 5 mg daily dose with an additional 5 to 10 mg as needed prior to sexual activity.  Using shared decision making, he would like to start with the daily Cialis with a boost dose as needed, and consider a repeat testosterone level in the future if no improvement.  PSA has been normal, most recently 0.84 from October 2023, reassurance provided.  Cialis refilled, can take up to 20 mg daily.  I recommended a 5 mg daily dose with 5 mg boost dose as needed Okay to check testosterone in the future if no improvement  Sondra Come, MD  [facility] [address], [address] [place], [place] [address] [phone]

## 2023-12-11 ENCOUNTER — Other Ambulatory Visit: Payer: Self-pay | Admitting: Urology
